# Patient Record
Sex: Female | Born: 1935 | Race: White | Hispanic: No | Smoking: Former smoker
Health system: Southern US, Community
[De-identification: ages and names within clinical notes are randomized; demographics above are authoritative.]

## PROBLEM LIST (undated history)

## (undated) DIAGNOSIS — I10 Essential (primary) hypertension: Secondary | ICD-10-CM

## (undated) HISTORY — PX: ABDOMINAL SURGERY: SHX537

---

## 2013-09-17 DIAGNOSIS — M948X9 Other specified disorders of cartilage, unspecified sites: Secondary | ICD-10-CM | POA: Diagnosis not present

## 2013-09-17 DIAGNOSIS — M7750 Other enthesopathy of unspecified foot: Secondary | ICD-10-CM | POA: Diagnosis not present

## 2013-09-17 DIAGNOSIS — M2010 Hallux valgus (acquired), unspecified foot: Secondary | ICD-10-CM | POA: Diagnosis not present

## 2013-09-17 DIAGNOSIS — M898X9 Other specified disorders of bone, unspecified site: Secondary | ICD-10-CM | POA: Diagnosis not present

## 2013-09-24 DIAGNOSIS — H409 Unspecified glaucoma: Secondary | ICD-10-CM | POA: Diagnosis not present

## 2013-09-24 DIAGNOSIS — H40019 Open angle with borderline findings, low risk, unspecified eye: Secondary | ICD-10-CM | POA: Diagnosis not present

## 2013-10-08 DIAGNOSIS — I10 Essential (primary) hypertension: Secondary | ICD-10-CM | POA: Diagnosis not present

## 2013-11-26 DIAGNOSIS — M948X9 Other specified disorders of cartilage, unspecified sites: Secondary | ICD-10-CM | POA: Diagnosis not present

## 2013-11-26 DIAGNOSIS — M7750 Other enthesopathy of unspecified foot: Secondary | ICD-10-CM | POA: Diagnosis not present

## 2013-11-26 DIAGNOSIS — L03039 Cellulitis of unspecified toe: Secondary | ICD-10-CM | POA: Diagnosis not present

## 2013-11-26 DIAGNOSIS — M898X9 Other specified disorders of bone, unspecified site: Secondary | ICD-10-CM | POA: Diagnosis not present

## 2014-02-04 DIAGNOSIS — M948X9 Other specified disorders of cartilage, unspecified sites: Secondary | ICD-10-CM | POA: Diagnosis not present

## 2014-02-04 DIAGNOSIS — L600 Ingrowing nail: Secondary | ICD-10-CM | POA: Diagnosis not present

## 2014-02-04 DIAGNOSIS — M898X9 Other specified disorders of bone, unspecified site: Secondary | ICD-10-CM | POA: Diagnosis not present

## 2014-02-04 DIAGNOSIS — L03039 Cellulitis of unspecified toe: Secondary | ICD-10-CM | POA: Diagnosis not present

## 2014-04-10 DIAGNOSIS — M898X9 Other specified disorders of bone, unspecified site: Secondary | ICD-10-CM | POA: Diagnosis not present

## 2014-04-10 DIAGNOSIS — L600 Ingrowing nail: Secondary | ICD-10-CM | POA: Diagnosis not present

## 2014-04-10 DIAGNOSIS — M948X9 Other specified disorders of cartilage, unspecified sites: Secondary | ICD-10-CM | POA: Diagnosis not present

## 2014-04-10 DIAGNOSIS — L03039 Cellulitis of unspecified toe: Secondary | ICD-10-CM | POA: Diagnosis not present

## 2014-04-18 DIAGNOSIS — I10 Essential (primary) hypertension: Secondary | ICD-10-CM | POA: Diagnosis not present

## 2014-05-01 DIAGNOSIS — J328 Other chronic sinusitis: Secondary | ICD-10-CM | POA: Diagnosis not present

## 2014-05-17 DIAGNOSIS — H35363 Drusen (degenerative) of macula, bilateral: Secondary | ICD-10-CM | POA: Diagnosis not present

## 2014-05-17 DIAGNOSIS — Z961 Presence of intraocular lens: Secondary | ICD-10-CM | POA: Diagnosis not present

## 2014-05-17 DIAGNOSIS — H40013 Open angle with borderline findings, low risk, bilateral: Secondary | ICD-10-CM | POA: Diagnosis not present

## 2014-05-17 DIAGNOSIS — Z9841 Cataract extraction status, right eye: Secondary | ICD-10-CM | POA: Diagnosis not present

## 2014-05-23 DIAGNOSIS — L570 Actinic keratosis: Secondary | ICD-10-CM | POA: Diagnosis not present

## 2014-06-10 DIAGNOSIS — L570 Actinic keratosis: Secondary | ICD-10-CM | POA: Diagnosis not present

## 2014-06-12 DIAGNOSIS — M2041 Other hammer toe(s) (acquired), right foot: Secondary | ICD-10-CM | POA: Diagnosis not present

## 2014-06-12 DIAGNOSIS — M2042 Other hammer toe(s) (acquired), left foot: Secondary | ICD-10-CM | POA: Diagnosis not present

## 2014-06-12 DIAGNOSIS — M2011 Hallux valgus (acquired), right foot: Secondary | ICD-10-CM | POA: Diagnosis not present

## 2014-06-12 DIAGNOSIS — M2012 Hallux valgus (acquired), left foot: Secondary | ICD-10-CM | POA: Diagnosis not present

## 2014-06-21 DIAGNOSIS — L570 Actinic keratosis: Secondary | ICD-10-CM | POA: Diagnosis not present

## 2014-07-11 DIAGNOSIS — L570 Actinic keratosis: Secondary | ICD-10-CM | POA: Diagnosis not present

## 2014-07-12 DIAGNOSIS — J45909 Unspecified asthma, uncomplicated: Secondary | ICD-10-CM | POA: Diagnosis not present

## 2014-07-16 DIAGNOSIS — J209 Acute bronchitis, unspecified: Secondary | ICD-10-CM | POA: Diagnosis not present

## 2014-08-14 DIAGNOSIS — M2012 Hallux valgus (acquired), left foot: Secondary | ICD-10-CM | POA: Diagnosis not present

## 2014-08-14 DIAGNOSIS — M2031 Hallux varus (acquired), right foot: Secondary | ICD-10-CM | POA: Diagnosis not present

## 2014-08-14 DIAGNOSIS — M2041 Other hammer toe(s) (acquired), right foot: Secondary | ICD-10-CM | POA: Diagnosis not present

## 2014-09-09 DIAGNOSIS — H1132 Conjunctival hemorrhage, left eye: Secondary | ICD-10-CM | POA: Diagnosis not present

## 2014-09-27 DIAGNOSIS — L570 Actinic keratosis: Secondary | ICD-10-CM | POA: Diagnosis not present

## 2014-10-16 DIAGNOSIS — B351 Tinea unguium: Secondary | ICD-10-CM | POA: Diagnosis not present

## 2014-10-16 DIAGNOSIS — M2042 Other hammer toe(s) (acquired), left foot: Secondary | ICD-10-CM | POA: Diagnosis not present

## 2014-10-16 DIAGNOSIS — B353 Tinea pedis: Secondary | ICD-10-CM | POA: Diagnosis not present

## 2014-10-16 DIAGNOSIS — M2041 Other hammer toe(s) (acquired), right foot: Secondary | ICD-10-CM | POA: Diagnosis not present

## 2014-11-18 DIAGNOSIS — Z961 Presence of intraocular lens: Secondary | ICD-10-CM | POA: Diagnosis not present

## 2014-11-18 DIAGNOSIS — Z9841 Cataract extraction status, right eye: Secondary | ICD-10-CM | POA: Diagnosis not present

## 2014-11-18 DIAGNOSIS — H40013 Open angle with borderline findings, low risk, bilateral: Secondary | ICD-10-CM | POA: Diagnosis not present

## 2014-11-18 DIAGNOSIS — H4011X1 Primary open-angle glaucoma, mild stage: Secondary | ICD-10-CM | POA: Diagnosis not present

## 2014-11-18 DIAGNOSIS — Z9842 Cataract extraction status, left eye: Secondary | ICD-10-CM | POA: Diagnosis not present

## 2014-12-18 DIAGNOSIS — B351 Tinea unguium: Secondary | ICD-10-CM | POA: Diagnosis not present

## 2014-12-18 DIAGNOSIS — L600 Ingrowing nail: Secondary | ICD-10-CM | POA: Diagnosis not present

## 2014-12-18 DIAGNOSIS — L249 Irritant contact dermatitis, unspecified cause: Secondary | ICD-10-CM | POA: Diagnosis not present

## 2014-12-18 DIAGNOSIS — B353 Tinea pedis: Secondary | ICD-10-CM | POA: Diagnosis not present

## 2014-12-20 DIAGNOSIS — I10 Essential (primary) hypertension: Secondary | ICD-10-CM | POA: Diagnosis not present

## 2015-02-19 DIAGNOSIS — L249 Irritant contact dermatitis, unspecified cause: Secondary | ICD-10-CM | POA: Diagnosis not present

## 2015-02-19 DIAGNOSIS — B351 Tinea unguium: Secondary | ICD-10-CM | POA: Diagnosis not present

## 2015-02-19 DIAGNOSIS — B353 Tinea pedis: Secondary | ICD-10-CM | POA: Diagnosis not present

## 2015-04-23 DIAGNOSIS — L249 Irritant contact dermatitis, unspecified cause: Secondary | ICD-10-CM | POA: Diagnosis not present

## 2015-04-23 DIAGNOSIS — M2042 Other hammer toe(s) (acquired), left foot: Secondary | ICD-10-CM | POA: Diagnosis not present

## 2015-04-23 DIAGNOSIS — B353 Tinea pedis: Secondary | ICD-10-CM | POA: Diagnosis not present

## 2015-04-23 DIAGNOSIS — M2041 Other hammer toe(s) (acquired), right foot: Secondary | ICD-10-CM | POA: Diagnosis not present

## 2015-05-26 DIAGNOSIS — H401131 Primary open-angle glaucoma, bilateral, mild stage: Secondary | ICD-10-CM | POA: Diagnosis not present

## 2015-06-18 DIAGNOSIS — L249 Irritant contact dermatitis, unspecified cause: Secondary | ICD-10-CM | POA: Diagnosis not present

## 2015-06-18 DIAGNOSIS — M2042 Other hammer toe(s) (acquired), left foot: Secondary | ICD-10-CM | POA: Diagnosis not present

## 2015-06-18 DIAGNOSIS — M2041 Other hammer toe(s) (acquired), right foot: Secondary | ICD-10-CM | POA: Diagnosis not present

## 2015-06-18 DIAGNOSIS — B353 Tinea pedis: Secondary | ICD-10-CM | POA: Diagnosis not present

## 2015-06-20 DIAGNOSIS — I10 Essential (primary) hypertension: Secondary | ICD-10-CM | POA: Diagnosis not present

## 2015-07-18 DIAGNOSIS — I10 Essential (primary) hypertension: Secondary | ICD-10-CM | POA: Diagnosis not present

## 2015-07-18 DIAGNOSIS — R0989 Other specified symptoms and signs involving the circulatory and respiratory systems: Secondary | ICD-10-CM | POA: Diagnosis not present

## 2015-07-18 DIAGNOSIS — Z9089 Acquired absence of other organs: Secondary | ICD-10-CM | POA: Diagnosis not present

## 2015-07-18 DIAGNOSIS — N179 Acute kidney failure, unspecified: Secondary | ICD-10-CM | POA: Diagnosis not present

## 2015-07-18 DIAGNOSIS — R9431 Abnormal electrocardiogram [ECG] [EKG]: Secondary | ICD-10-CM | POA: Diagnosis not present

## 2015-07-18 DIAGNOSIS — Z87891 Personal history of nicotine dependence: Secondary | ICD-10-CM | POA: Diagnosis not present

## 2015-07-18 DIAGNOSIS — R0789 Other chest pain: Secondary | ICD-10-CM | POA: Diagnosis not present

## 2015-07-18 DIAGNOSIS — R072 Precordial pain: Secondary | ICD-10-CM | POA: Diagnosis not present

## 2015-07-18 DIAGNOSIS — R079 Chest pain, unspecified: Secondary | ICD-10-CM | POA: Diagnosis not present

## 2015-07-18 DIAGNOSIS — Z79899 Other long term (current) drug therapy: Secondary | ICD-10-CM | POA: Diagnosis not present

## 2015-07-19 DIAGNOSIS — R0789 Other chest pain: Secondary | ICD-10-CM | POA: Diagnosis not present

## 2015-07-19 DIAGNOSIS — I6523 Occlusion and stenosis of bilateral carotid arteries: Secondary | ICD-10-CM | POA: Diagnosis not present

## 2015-07-19 DIAGNOSIS — I10 Essential (primary) hypertension: Secondary | ICD-10-CM | POA: Diagnosis not present

## 2015-07-19 DIAGNOSIS — R079 Chest pain, unspecified: Secondary | ICD-10-CM | POA: Diagnosis not present

## 2015-07-19 DIAGNOSIS — N179 Acute kidney failure, unspecified: Secondary | ICD-10-CM | POA: Diagnosis not present

## 2015-07-22 DIAGNOSIS — I209 Angina pectoris, unspecified: Secondary | ICD-10-CM | POA: Diagnosis not present

## 2015-07-23 DIAGNOSIS — I209 Angina pectoris, unspecified: Secondary | ICD-10-CM | POA: Diagnosis not present

## 2015-08-25 DIAGNOSIS — L249 Irritant contact dermatitis, unspecified cause: Secondary | ICD-10-CM | POA: Diagnosis not present

## 2015-08-25 DIAGNOSIS — M2042 Other hammer toe(s) (acquired), left foot: Secondary | ICD-10-CM | POA: Diagnosis not present

## 2015-08-25 DIAGNOSIS — M7741 Metatarsalgia, right foot: Secondary | ICD-10-CM | POA: Diagnosis not present

## 2015-08-25 DIAGNOSIS — M2041 Other hammer toe(s) (acquired), right foot: Secondary | ICD-10-CM | POA: Diagnosis not present

## 2015-11-03 DIAGNOSIS — M2041 Other hammer toe(s) (acquired), right foot: Secondary | ICD-10-CM | POA: Diagnosis not present

## 2015-11-03 DIAGNOSIS — M7741 Metatarsalgia, right foot: Secondary | ICD-10-CM | POA: Diagnosis not present

## 2015-11-03 DIAGNOSIS — L249 Irritant contact dermatitis, unspecified cause: Secondary | ICD-10-CM | POA: Diagnosis not present

## 2015-11-03 DIAGNOSIS — M2042 Other hammer toe(s) (acquired), left foot: Secondary | ICD-10-CM | POA: Diagnosis not present

## 2015-11-17 DIAGNOSIS — H26491 Other secondary cataract, right eye: Secondary | ICD-10-CM | POA: Diagnosis not present

## 2015-11-17 DIAGNOSIS — H26493 Other secondary cataract, bilateral: Secondary | ICD-10-CM | POA: Diagnosis not present

## 2015-11-17 DIAGNOSIS — H401131 Primary open-angle glaucoma, bilateral, mild stage: Secondary | ICD-10-CM | POA: Diagnosis not present

## 2015-12-19 DIAGNOSIS — R0781 Pleurodynia: Secondary | ICD-10-CM | POA: Diagnosis not present

## 2015-12-19 DIAGNOSIS — J811 Chronic pulmonary edema: Secondary | ICD-10-CM | POA: Diagnosis not present

## 2015-12-25 DIAGNOSIS — I10 Essential (primary) hypertension: Secondary | ICD-10-CM | POA: Diagnosis not present

## 2016-01-08 ENCOUNTER — Emergency Department (INDEPENDENT_AMBULATORY_CARE_PROVIDER_SITE_OTHER): Payer: Medicare Other

## 2016-01-08 ENCOUNTER — Emergency Department (INDEPENDENT_AMBULATORY_CARE_PROVIDER_SITE_OTHER)
Admission: EM | Admit: 2016-01-08 | Discharge: 2016-01-08 | Disposition: A | Discharge status: Home / Self Care | Payer: Medicare Other | Source: Home / Self Care | Attending: Family Medicine | Admitting: Family Medicine

## 2016-01-08 ENCOUNTER — Encounter: Payer: Self-pay | Admitting: *Deleted

## 2016-01-08 DIAGNOSIS — J189 Pneumonia, unspecified organism: Secondary | ICD-10-CM | POA: Diagnosis not present

## 2016-01-08 DIAGNOSIS — J181 Lobar pneumonia, unspecified organism: Principal | ICD-10-CM

## 2016-01-08 DIAGNOSIS — R05 Cough: Secondary | ICD-10-CM | POA: Diagnosis not present

## 2016-01-08 DIAGNOSIS — J449 Chronic obstructive pulmonary disease, unspecified: Secondary | ICD-10-CM

## 2016-01-08 HISTORY — DX: Essential (primary) hypertension: I10

## 2016-01-08 MED ORDER — AZITHROMYCIN 250 MG PO TABS: 250.0000 mg | ORAL_TABLET | Freq: Every day | ORAL | Status: AC

## 2016-01-08 NOTE — ED Provider Notes (Signed)
CSN: [id number]     Arrival date & time 01/08/16  1402 History   None    Chief Complaint  Patient presents with  . Cough   (Consider location/radiation/quality/duration/timing/severity/associated sxs/prior Treatment) HPI  The pt is an 82yo female accompanied by her daughter, presenting to [facility] with c/o moderately productive cough with gray/green sputum for 2-3 weeks, gradually worsening and reports of blood tinged sputum this morning. Pt visiting from [place]. She was seen initially by her PCP in [place] about 1 month ago and prescribed Doxycycline. She took the medication for about 3 days, was feeling better so she stopped as she noticed on the medication warnings to not take if you have a fungal infection, pt reports 20 years of toenail fungal infection and didn't want to make it worse.  She did take Mucinex last night w/o relief. Denies hx of asthma or COPD.  Denies fever, chills, n/v/d.   Past Medical History  Diagnosis Date  . Hypertension    Past Surgical History  Procedure Laterality Date  . Abdominal surgery     Family History  Problem Relation Age of Onset  . Heart disease Other    Social History  Substance Use Topics  . Smoking status: Former Research scientist (life sciences)  . Smokeless tobacco: Never Used  . Alcohol Use: Yes   OB History    No data available     Review of Systems  Constitutional: Negative for fever, chills and appetite change.  HENT: Positive for congestion and rhinorrhea. Negative for sinus pressure and sore throat.   Respiratory: Positive for cough. Negative for shortness of breath and wheezing.   Gastrointestinal: Negative for nausea, vomiting, abdominal pain and diarrhea.  Neurological: Negative for dizziness, light-headedness and headaches.    Allergies  Review of patient's allergies indicates no known allergies.  Home Medications   Prior to Admission medications   Medication Sig Start Date End Date Taking? Authorizing Provider  LABETALOL HCL PO Take by mouth.    Yes Historical Provider, MD  TRIAMTERENE PO Take 0.25 mg by mouth.   Yes Historical Provider, MD  azithromycin (ZITHROMAX) 250 MG tablet Take 1 tablet (250 mg total) by mouth daily. Take first 2 tablets together, then 1 every day until finished. 01/08/16   Noland Fordyce, PA-C   Meds Ordered and Administered this Visit  Medications - No data to display  BP 130/74 mmHg  Pulse 107  Temp(Src) 98.1 F (36.7 C) (Oral)  Resp 16  Wt 154 lb (69.854 kg)  SpO2 93% No data found.   Physical Exam  Constitutional: She appears well-developed and well-nourished. No distress.  HENT:  Head: Normocephalic and atraumatic.  Right Ear: Tympanic membrane normal.  Left Ear: Tympanic membrane normal.  Nose: Nose normal.  Mouth/Throat: Uvula is midline, oropharynx is clear and moist and mucous membranes are normal.  Eyes: Conjunctivae are normal. No scleral icterus.  Neck: Normal range of motion. Neck supple.  Cardiovascular: Normal rate, regular rhythm and normal heart sounds.   Mild tachycardia in triage, regular rate and rhythm on exam.  Pulmonary/Chest: Effort normal and breath sounds normal. No respiratory distress. She has no wheezes. She has no rales.  Abdominal: Soft. She exhibits no distension. There is no tenderness.  Musculoskeletal: Normal range of motion.  Neurological: She is alert.  Skin: Skin is warm and dry. She is not diaphoretic.  Nursing note and vitals reviewed.   ED Course  Procedures (including critical care time)  Labs Review Labs Reviewed - No data to display  Imaging Review Dg Chest 2 View  01/08/2016  CLINICAL DATA:  Cough and congestion for 1 month, former smoker EXAM: CHEST  2 VIEW COMPARISON:  None. FINDINGS: Hyperinflation consistent with COPD. Bilateral moderate perihilar bronchitic change and mild bronchiectasis. No pleural effusion. Interposition of the colon on the right side is identified. There is patchy right upper lobe multi nodular opacity. IMPRESSION: COPD  with chronic bronchitic change. Superimposed patchy right upper lobe infiltrate concerning for pneumonia. Followup PA and lateral chest X-ray is recommended in 3-4 weeks following trial of antibiotic therapy to ensure resolution and exclude underlying malignancy. Electronically Signed   By: Skipper Cliche M.D.   On: 01/08/2016 14:59      MDM   1. Right upper lobe pneumonia   2. COPD suggested by initial evaluation ([place])    Pt c/o worsening cough and congestion with blood tinged sputum.  She was initially treated with half a course of Doxycycline 1 month ago. Pt appears well, afebrile, no respiratory distress. Lungs: CTAB on exam, however, due to duration of symptoms and reports of blood-tinged sputum, CXR obtained.  CXR: COPD with chronic bronchitis change. RUL infiltrate concerning for pneumonia.  Will treat will full course of azithromycin, encouraged f/u with PCP for repeat CXR to ensure resolution and exclude underlying malignancy (especially with past hx of cigarette smoking)  Pt notes she will likely be back home in [facility] by then. Copy of radiology report given to pt to bring to her PCP. May return soon if symptoms worsening. Patient and daughter verbalized understanding and agreement with treatment plan.    Noland Fordyce, PA-C 01/08/16 1515

## 2016-01-08 NOTE — Discharge Instructions (Signed)

## 2016-01-08 NOTE — ED Notes (Signed)
Pt c/o cough x 1 month. She was initially treated in [place], took 3 days of doxy and prednisone. She improved then later developed drainage, now productive cough with gray/green sputum. This morning it was blood tinged. Denies fever or SOB. Took Mucinex last night.

## 2016-01-10 ENCOUNTER — Telehealth: Payer: Self-pay | Admitting: Emergency Medicine

## 2016-01-10 NOTE — ED Notes (Signed)
Left message advising that if pt is doing well, no need to return call, if having any problems, to contact the office.  [place]

## 2016-01-21 DIAGNOSIS — I10 Essential (primary) hypertension: Secondary | ICD-10-CM | POA: Diagnosis not present

## 2016-01-21 DIAGNOSIS — B351 Tinea unguium: Secondary | ICD-10-CM | POA: Diagnosis not present

## 2016-01-21 DIAGNOSIS — Z0001 Encounter for general adult medical examination with abnormal findings: Secondary | ICD-10-CM | POA: Diagnosis not present

## 2016-01-21 DIAGNOSIS — R1903 Right lower quadrant abdominal swelling, mass and lump: Secondary | ICD-10-CM | POA: Diagnosis not present

## 2016-01-21 DIAGNOSIS — Z1389 Encounter for screening for other disorder: Secondary | ICD-10-CM | POA: Diagnosis not present

## 2016-01-21 DIAGNOSIS — Z6824 Body mass index (BMI) 24.0-24.9, adult: Secondary | ICD-10-CM | POA: Diagnosis not present

## 2016-02-04 DIAGNOSIS — S82109A Unspecified fracture of upper end of unspecified tibia, initial encounter for closed fracture: Secondary | ICD-10-CM | POA: Diagnosis not present

## 2016-02-04 DIAGNOSIS — S52124A Nondisplaced fracture of head of right radius, initial encounter for closed fracture: Secondary | ICD-10-CM | POA: Diagnosis not present

## 2016-02-04 DIAGNOSIS — S4991XA Unspecified injury of right shoulder and upper arm, initial encounter: Secondary | ICD-10-CM | POA: Diagnosis not present

## 2016-02-04 DIAGNOSIS — S52134A Nondisplaced fracture of neck of right radius, initial encounter for closed fracture: Secondary | ICD-10-CM | POA: Diagnosis not present

## 2016-02-04 DIAGNOSIS — I10 Essential (primary) hypertension: Secondary | ICD-10-CM | POA: Diagnosis not present

## 2016-02-06 DIAGNOSIS — S52121A Displaced fracture of head of right radius, initial encounter for closed fracture: Secondary | ICD-10-CM | POA: Diagnosis not present

## 2016-02-16 DIAGNOSIS — S52121A Displaced fracture of head of right radius, initial encounter for closed fracture: Secondary | ICD-10-CM | POA: Diagnosis not present

## 2016-03-01 DIAGNOSIS — S52121A Displaced fracture of head of right radius, initial encounter for closed fracture: Secondary | ICD-10-CM | POA: Diagnosis not present

## 2016-03-15 DIAGNOSIS — H401131 Primary open-angle glaucoma, bilateral, mild stage: Secondary | ICD-10-CM | POA: Diagnosis not present

## 2016-03-29 DIAGNOSIS — S52121A Displaced fracture of head of right radius, initial encounter for closed fracture: Secondary | ICD-10-CM | POA: Diagnosis not present

## 2016-03-29 DIAGNOSIS — M2042 Other hammer toe(s) (acquired), left foot: Secondary | ICD-10-CM | POA: Diagnosis not present

## 2016-03-29 DIAGNOSIS — M2041 Other hammer toe(s) (acquired), right foot: Secondary | ICD-10-CM | POA: Diagnosis not present

## 2016-03-29 DIAGNOSIS — B351 Tinea unguium: Secondary | ICD-10-CM | POA: Diagnosis not present

## 2016-03-29 DIAGNOSIS — M7741 Metatarsalgia, right foot: Secondary | ICD-10-CM | POA: Diagnosis not present

## 2016-03-30 DIAGNOSIS — M170 Bilateral primary osteoarthritis of knee: Secondary | ICD-10-CM | POA: Diagnosis not present

## 2016-03-30 DIAGNOSIS — M25562 Pain in left knee: Secondary | ICD-10-CM | POA: Diagnosis not present

## 2016-04-21 DIAGNOSIS — Z6825 Body mass index (BMI) 25.0-25.9, adult: Secondary | ICD-10-CM | POA: Diagnosis not present

## 2016-04-21 DIAGNOSIS — M1710 Unilateral primary osteoarthritis, unspecified knee: Secondary | ICD-10-CM | POA: Diagnosis not present

## 2016-04-21 DIAGNOSIS — E785 Hyperlipidemia, unspecified: Secondary | ICD-10-CM | POA: Diagnosis not present

## 2016-04-21 DIAGNOSIS — I10 Essential (primary) hypertension: Secondary | ICD-10-CM | POA: Diagnosis not present

## 2016-05-19 DIAGNOSIS — M810 Age-related osteoporosis without current pathological fracture: Secondary | ICD-10-CM | POA: Diagnosis not present

## 2016-05-19 DIAGNOSIS — Z1231 Encounter for screening mammogram for malignant neoplasm of breast: Secondary | ICD-10-CM | POA: Diagnosis not present

## 2016-05-19 DIAGNOSIS — M85852 Other specified disorders of bone density and structure, left thigh: Secondary | ICD-10-CM | POA: Diagnosis not present

## 2016-05-19 DIAGNOSIS — M85851 Other specified disorders of bone density and structure, right thigh: Secondary | ICD-10-CM | POA: Diagnosis not present

## 2016-05-31 DIAGNOSIS — M2041 Other hammer toe(s) (acquired), right foot: Secondary | ICD-10-CM | POA: Diagnosis not present

## 2016-05-31 DIAGNOSIS — M2012 Hallux valgus (acquired), left foot: Secondary | ICD-10-CM | POA: Diagnosis not present

## 2016-05-31 DIAGNOSIS — M2042 Other hammer toe(s) (acquired), left foot: Secondary | ICD-10-CM | POA: Diagnosis not present

## 2016-05-31 DIAGNOSIS — M2011 Hallux valgus (acquired), right foot: Secondary | ICD-10-CM | POA: Diagnosis not present

## 2016-06-01 DIAGNOSIS — M25562 Pain in left knee: Secondary | ICD-10-CM | POA: Diagnosis not present

## 2016-06-15 DIAGNOSIS — E785 Hyperlipidemia, unspecified: Secondary | ICD-10-CM | POA: Diagnosis not present

## 2016-06-15 DIAGNOSIS — D481 Neoplasm of uncertain behavior of connective and other soft tissue: Secondary | ICD-10-CM | POA: Diagnosis not present

## 2016-06-15 DIAGNOSIS — H401131 Primary open-angle glaucoma, bilateral, mild stage: Secondary | ICD-10-CM | POA: Diagnosis not present

## 2016-06-15 DIAGNOSIS — I10 Essential (primary) hypertension: Secondary | ICD-10-CM | POA: Diagnosis not present

## 2016-07-08 DIAGNOSIS — I10 Essential (primary) hypertension: Secondary | ICD-10-CM | POA: Diagnosis not present

## 2016-07-08 DIAGNOSIS — S8001XA Contusion of right knee, initial encounter: Secondary | ICD-10-CM | POA: Diagnosis not present

## 2016-07-08 DIAGNOSIS — Z79899 Other long term (current) drug therapy: Secondary | ICD-10-CM | POA: Diagnosis not present

## 2016-07-13 DIAGNOSIS — M25562 Pain in left knee: Secondary | ICD-10-CM | POA: Diagnosis not present

## 2016-07-13 DIAGNOSIS — M170 Bilateral primary osteoarthritis of knee: Secondary | ICD-10-CM | POA: Diagnosis not present

## 2016-08-13 DIAGNOSIS — I10 Essential (primary) hypertension: Secondary | ICD-10-CM | POA: Diagnosis not present

## 2016-08-13 DIAGNOSIS — E559 Vitamin D deficiency, unspecified: Secondary | ICD-10-CM | POA: Diagnosis not present

## 2016-08-13 DIAGNOSIS — Z0001 Encounter for general adult medical examination with abnormal findings: Secondary | ICD-10-CM | POA: Diagnosis not present

## 2016-08-13 DIAGNOSIS — Z79899 Other long term (current) drug therapy: Secondary | ICD-10-CM | POA: Diagnosis not present

## 2016-08-13 DIAGNOSIS — E785 Hyperlipidemia, unspecified: Secondary | ICD-10-CM | POA: Diagnosis not present

## 2016-08-19 DIAGNOSIS — Z6824 Body mass index (BMI) 24.0-24.9, adult: Secondary | ICD-10-CM | POA: Diagnosis not present

## 2016-08-19 DIAGNOSIS — K4090 Unilateral inguinal hernia, without obstruction or gangrene, not specified as recurrent: Secondary | ICD-10-CM | POA: Diagnosis not present

## 2016-08-19 DIAGNOSIS — E785 Hyperlipidemia, unspecified: Secondary | ICD-10-CM | POA: Diagnosis not present

## 2016-08-19 DIAGNOSIS — N183 Chronic kidney disease, stage 3 (moderate): Secondary | ICD-10-CM | POA: Diagnosis not present

## 2016-08-19 DIAGNOSIS — I10 Essential (primary) hypertension: Secondary | ICD-10-CM | POA: Diagnosis not present

## 2016-08-25 DIAGNOSIS — D481 Neoplasm of uncertain behavior of connective and other soft tissue: Secondary | ICD-10-CM | POA: Diagnosis not present

## 2016-08-25 DIAGNOSIS — H401131 Primary open-angle glaucoma, bilateral, mild stage: Secondary | ICD-10-CM | POA: Diagnosis not present

## 2016-10-14 DIAGNOSIS — M2042 Other hammer toe(s) (acquired), left foot: Secondary | ICD-10-CM | POA: Diagnosis not present

## 2016-10-14 DIAGNOSIS — M2041 Other hammer toe(s) (acquired), right foot: Secondary | ICD-10-CM | POA: Diagnosis not present

## 2016-10-14 DIAGNOSIS — M2011 Hallux valgus (acquired), right foot: Secondary | ICD-10-CM | POA: Diagnosis not present

## 2016-10-14 DIAGNOSIS — B351 Tinea unguium: Secondary | ICD-10-CM | POA: Diagnosis not present

## 2016-11-24 DIAGNOSIS — H401131 Primary open-angle glaucoma, bilateral, mild stage: Secondary | ICD-10-CM | POA: Diagnosis not present

## 2016-11-24 DIAGNOSIS — D481 Neoplasm of uncertain behavior of connective and other soft tissue: Secondary | ICD-10-CM | POA: Diagnosis not present

## 2016-11-25 DIAGNOSIS — I10 Essential (primary) hypertension: Secondary | ICD-10-CM | POA: Diagnosis not present

## 2016-11-25 DIAGNOSIS — Z6831 Body mass index (BMI) 31.0-31.9, adult: Secondary | ICD-10-CM | POA: Diagnosis not present

## 2016-11-25 DIAGNOSIS — N183 Chronic kidney disease, stage 3 (moderate): Secondary | ICD-10-CM | POA: Diagnosis not present

## 2016-11-25 DIAGNOSIS — K4090 Unilateral inguinal hernia, without obstruction or gangrene, not specified as recurrent: Secondary | ICD-10-CM | POA: Diagnosis not present

## 2016-11-25 DIAGNOSIS — Z09 Encounter for follow-up examination after completed treatment for conditions other than malignant neoplasm: Secondary | ICD-10-CM | POA: Diagnosis not present

## 2016-11-25 DIAGNOSIS — E782 Mixed hyperlipidemia: Secondary | ICD-10-CM | POA: Diagnosis not present

## 2016-12-16 DIAGNOSIS — M2041 Other hammer toe(s) (acquired), right foot: Secondary | ICD-10-CM | POA: Diagnosis not present

## 2016-12-16 DIAGNOSIS — L249 Irritant contact dermatitis, unspecified cause: Secondary | ICD-10-CM | POA: Diagnosis not present

## 2016-12-16 DIAGNOSIS — M2042 Other hammer toe(s) (acquired), left foot: Secondary | ICD-10-CM | POA: Diagnosis not present

## 2016-12-16 DIAGNOSIS — B351 Tinea unguium: Secondary | ICD-10-CM | POA: Diagnosis not present

## 2017-02-14 DIAGNOSIS — L249 Irritant contact dermatitis, unspecified cause: Secondary | ICD-10-CM | POA: Diagnosis not present

## 2017-02-14 DIAGNOSIS — B351 Tinea unguium: Secondary | ICD-10-CM | POA: Diagnosis not present

## 2017-02-14 DIAGNOSIS — M2041 Other hammer toe(s) (acquired), right foot: Secondary | ICD-10-CM | POA: Diagnosis not present

## 2017-02-14 DIAGNOSIS — M2042 Other hammer toe(s) (acquired), left foot: Secondary | ICD-10-CM | POA: Diagnosis not present

## 2017-02-23 DIAGNOSIS — L821 Other seborrheic keratosis: Secondary | ICD-10-CM | POA: Diagnosis not present

## 2017-03-22 DIAGNOSIS — M170 Bilateral primary osteoarthritis of knee: Secondary | ICD-10-CM | POA: Diagnosis not present

## 2017-03-28 DIAGNOSIS — D485 Neoplasm of uncertain behavior of skin: Secondary | ICD-10-CM | POA: Diagnosis not present

## 2017-04-06 DIAGNOSIS — M2042 Other hammer toe(s) (acquired), left foot: Secondary | ICD-10-CM | POA: Diagnosis not present

## 2017-04-06 DIAGNOSIS — M2041 Other hammer toe(s) (acquired), right foot: Secondary | ICD-10-CM | POA: Diagnosis not present

## 2017-04-06 DIAGNOSIS — G5761 Lesion of plantar nerve, right lower limb: Secondary | ICD-10-CM | POA: Diagnosis not present

## 2017-04-06 DIAGNOSIS — S92514A Nondisplaced fracture of proximal phalanx of right lesser toe(s), initial encounter for closed fracture: Secondary | ICD-10-CM | POA: Diagnosis not present

## 2017-04-14 DIAGNOSIS — Z87891 Personal history of nicotine dependence: Secondary | ICD-10-CM | POA: Diagnosis not present

## 2017-04-14 DIAGNOSIS — R0781 Pleurodynia: Secondary | ICD-10-CM | POA: Diagnosis not present

## 2017-04-14 DIAGNOSIS — S022XXA Fracture of nasal bones, initial encounter for closed fracture: Secondary | ICD-10-CM | POA: Diagnosis not present

## 2017-04-14 DIAGNOSIS — S20211A Contusion of right front wall of thorax, initial encounter: Secondary | ICD-10-CM | POA: Diagnosis not present

## 2017-04-14 DIAGNOSIS — S2020XA Contusion of thorax, unspecified, initial encounter: Secondary | ICD-10-CM | POA: Diagnosis not present

## 2017-04-14 DIAGNOSIS — I10 Essential (primary) hypertension: Secondary | ICD-10-CM | POA: Diagnosis not present

## 2017-04-18 DIAGNOSIS — M7741 Metatarsalgia, right foot: Secondary | ICD-10-CM | POA: Diagnosis not present

## 2017-04-18 DIAGNOSIS — M2041 Other hammer toe(s) (acquired), right foot: Secondary | ICD-10-CM | POA: Diagnosis not present

## 2017-04-18 DIAGNOSIS — M2042 Other hammer toe(s) (acquired), left foot: Secondary | ICD-10-CM | POA: Diagnosis not present

## 2017-04-18 DIAGNOSIS — M2011 Hallux valgus (acquired), right foot: Secondary | ICD-10-CM | POA: Diagnosis not present

## 2017-04-25 DIAGNOSIS — H47233 Glaucomatous optic atrophy, bilateral: Secondary | ICD-10-CM | POA: Diagnosis not present

## 2017-04-25 DIAGNOSIS — H401131 Primary open-angle glaucoma, bilateral, mild stage: Secondary | ICD-10-CM | POA: Diagnosis not present

## 2017-05-09 DIAGNOSIS — Z0000 Encounter for general adult medical examination without abnormal findings: Secondary | ICD-10-CM | POA: Diagnosis not present

## 2017-05-09 DIAGNOSIS — E669 Obesity, unspecified: Secondary | ICD-10-CM | POA: Diagnosis not present

## 2017-05-09 DIAGNOSIS — N183 Chronic kidney disease, stage 3 (moderate): Secondary | ICD-10-CM | POA: Diagnosis not present

## 2017-05-09 DIAGNOSIS — E782 Mixed hyperlipidemia: Secondary | ICD-10-CM | POA: Diagnosis not present

## 2017-05-09 DIAGNOSIS — I129 Hypertensive chronic kidney disease with stage 1 through stage 4 chronic kidney disease, or unspecified chronic kidney disease: Secondary | ICD-10-CM | POA: Diagnosis not present

## 2017-05-09 DIAGNOSIS — Z23 Encounter for immunization: Secondary | ICD-10-CM | POA: Diagnosis not present

## 2017-05-09 DIAGNOSIS — R6889 Other general symptoms and signs: Secondary | ICD-10-CM | POA: Diagnosis not present

## 2017-05-09 DIAGNOSIS — Z6832 Body mass index (BMI) 32.0-32.9, adult: Secondary | ICD-10-CM | POA: Diagnosis not present

## 2017-05-09 DIAGNOSIS — Z1389 Encounter for screening for other disorder: Secondary | ICD-10-CM | POA: Diagnosis not present

## 2017-06-20 DIAGNOSIS — M2042 Other hammer toe(s) (acquired), left foot: Secondary | ICD-10-CM | POA: Diagnosis not present

## 2017-06-20 DIAGNOSIS — M2041 Other hammer toe(s) (acquired), right foot: Secondary | ICD-10-CM | POA: Diagnosis not present

## 2017-06-20 DIAGNOSIS — M7741 Metatarsalgia, right foot: Secondary | ICD-10-CM | POA: Diagnosis not present

## 2017-06-20 DIAGNOSIS — M2011 Hallux valgus (acquired), right foot: Secondary | ICD-10-CM | POA: Diagnosis not present

## 2017-07-11 DIAGNOSIS — G301 Alzheimer's disease with late onset: Secondary | ICD-10-CM | POA: Diagnosis not present

## 2017-07-11 DIAGNOSIS — F0280 Dementia in other diseases classified elsewhere without behavioral disturbance: Secondary | ICD-10-CM | POA: Diagnosis not present

## 2018-05-13 IMAGING — DX DG CHEST 2V
2 series · 2 of 2 positions shown · non-contrast
Comparison: None.

CLINICAL DATA: Cough and congestion for 1 month, former smoker

EXAM:
CHEST  2 VIEW

[chest pa]
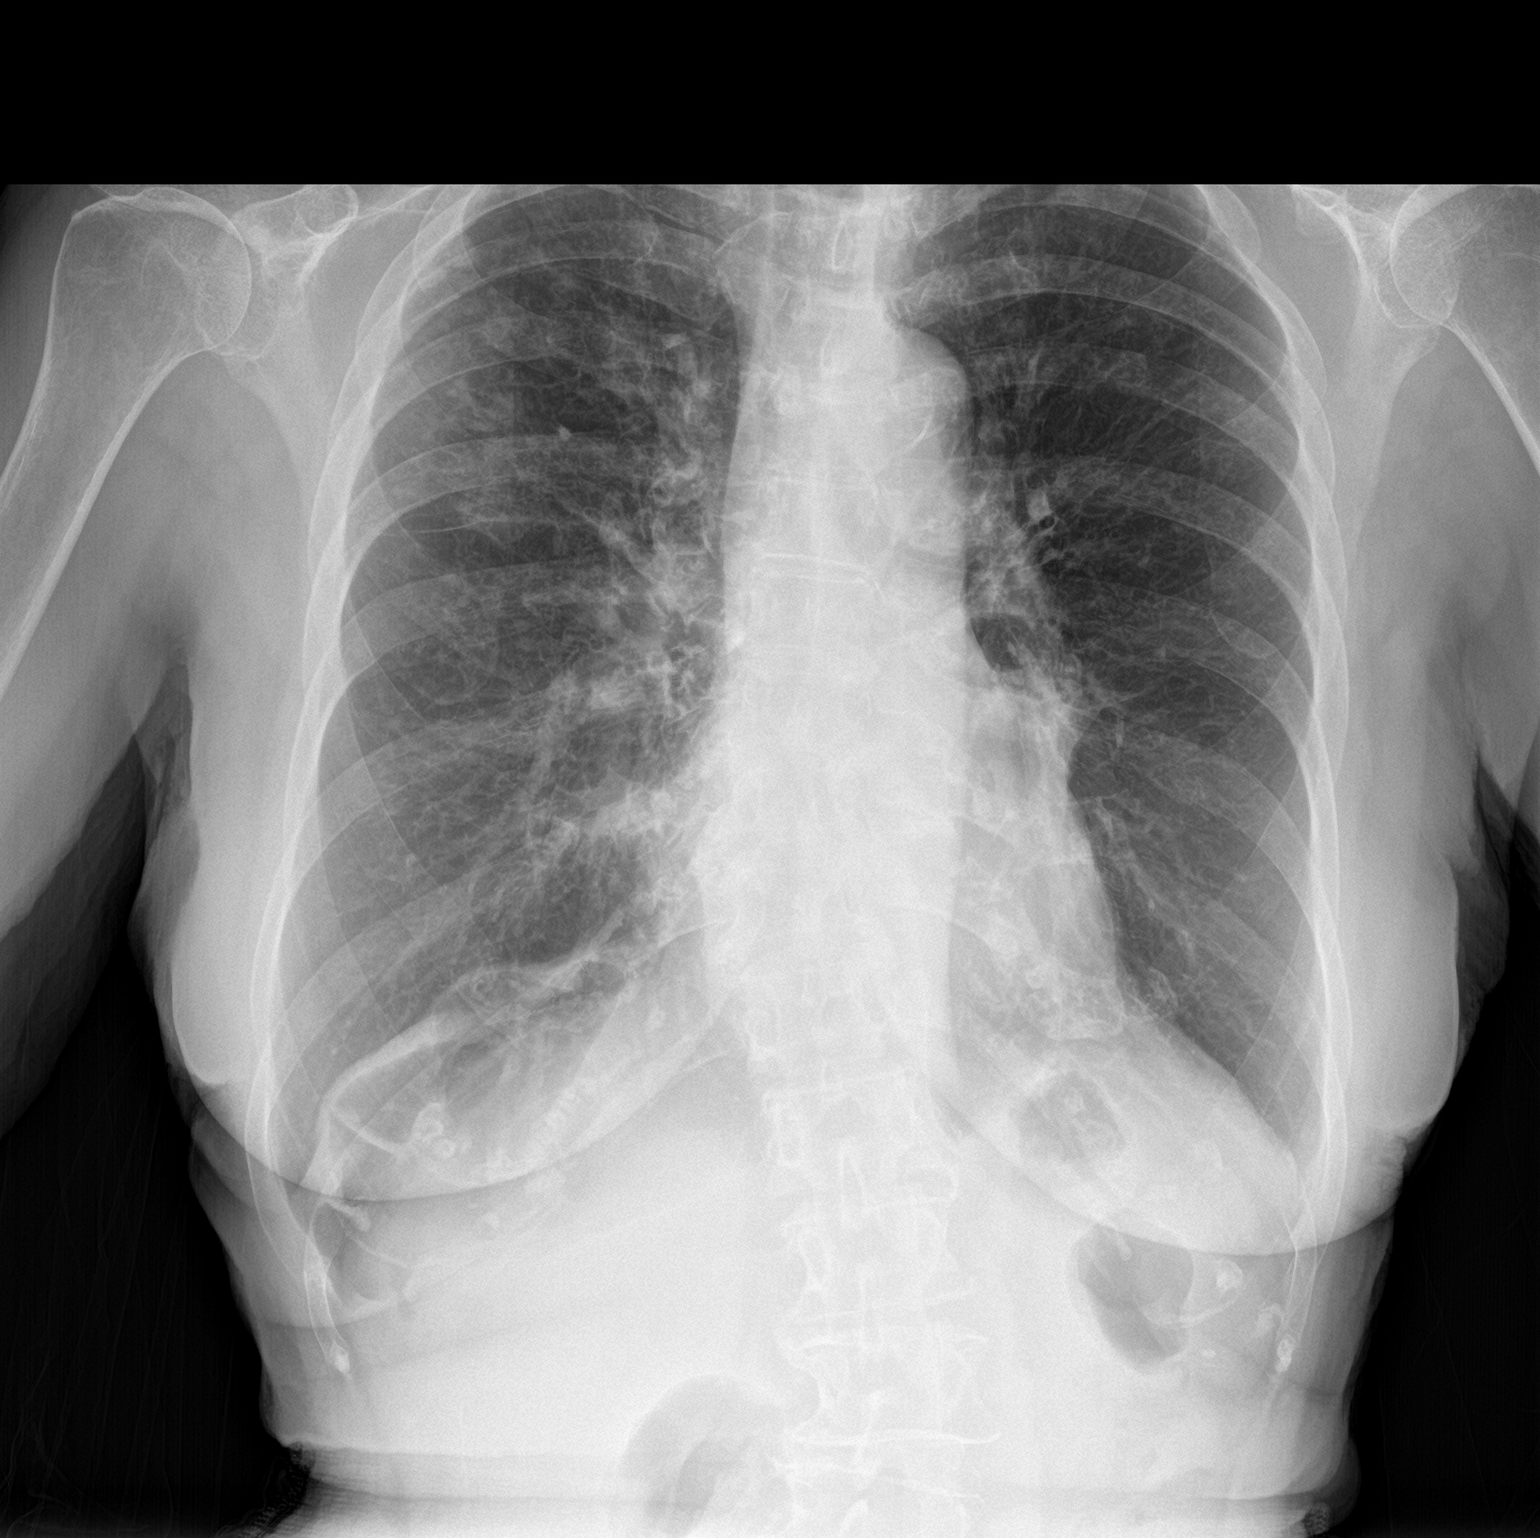

[chest lat]
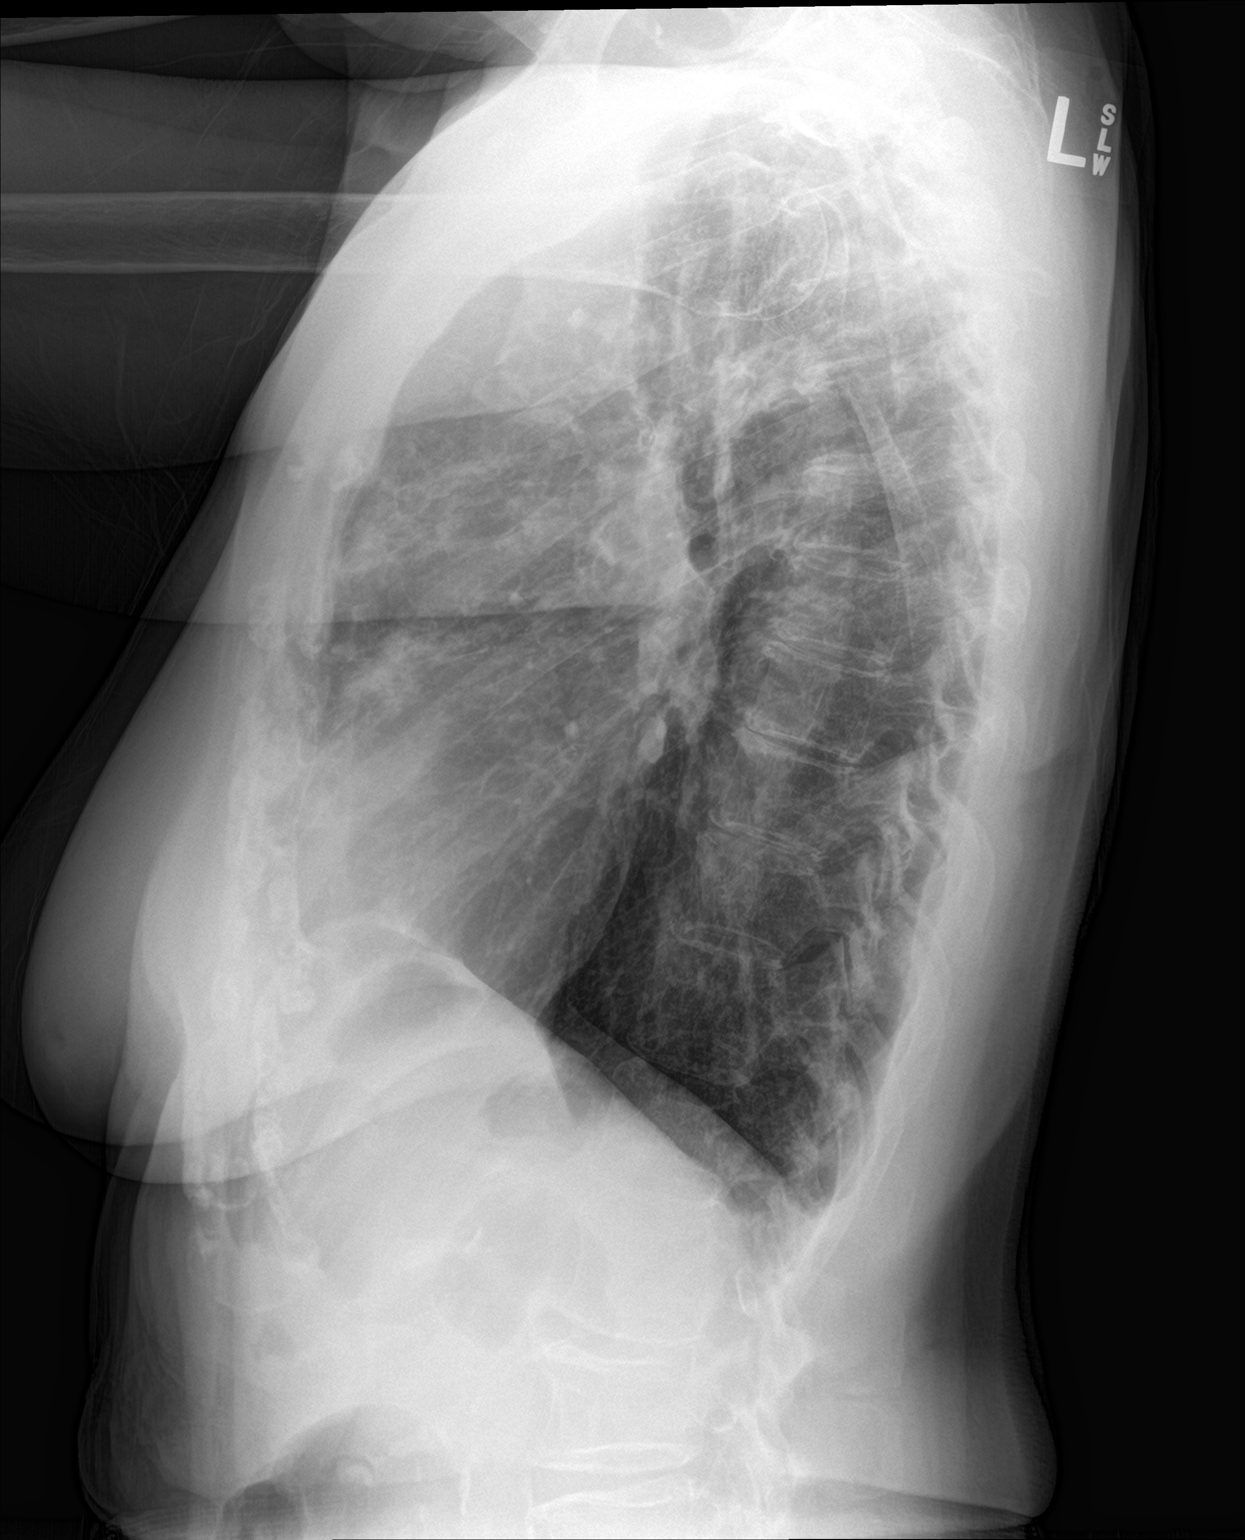

[2 of 2 positions shown; findings below may reference images not displayed]

FINDINGS: Hyperinflation consistent with COPD. Bilateral moderate perihilar
bronchitic change and mild bronchiectasis. No pleural effusion.
Interposition of the colon on the right side is identified.

There is patchy right upper lobe multi nodular opacity.
IMPRESSION: COPD with chronic bronchitic change. Superimposed patchy right upper
lobe infiltrate concerning for pneumonia. Followup PA and lateral
chest X-ray is recommended in 3-4 weeks following trial of
antibiotic therapy to ensure resolution and exclude underlying
malignancy.

## 2021-09-02 DEATH — deceased
